# Patient Record
Sex: Male | Born: 2005 | Race: Black or African American | Hispanic: No | Marital: Single | Smoking: Never smoker
Health system: Southern US, Community
[De-identification: ages and names within clinical notes are randomized; demographics above are authoritative.]

---
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
## 2024-05-10 ENCOUNTER — Ambulatory Visit
